# Patient Record
Sex: Male | Born: 2000 | Race: White | Hispanic: No | Marital: Single | Smoking: Former smoker
Health system: Southern US, Community
[De-identification: ages and names within clinical notes are randomized; demographics above are authoritative.]

## PROBLEM LIST (undated history)

## (undated) DIAGNOSIS — F909 Attention-deficit hyperactivity disorder, unspecified type: Secondary | ICD-10-CM

## (undated) HISTORY — PX: OTHER SURGICAL HISTORY: SHX169

---
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
## 2021-05-30 ENCOUNTER — Emergency Department
Admission: EM | Admit: 2021-05-30 | Discharge: 2021-05-30 | Disposition: A | Discharge status: Home / Self Care | Payer: Medicaid Other | Attending: Emergency Medicine | Admitting: Emergency Medicine

## 2021-05-30 ENCOUNTER — Other Ambulatory Visit: Payer: Self-pay

## 2021-05-30 DIAGNOSIS — Z87891 Personal history of nicotine dependence: Secondary | ICD-10-CM | POA: Diagnosis not present

## 2021-05-30 DIAGNOSIS — Z20822 Contact with and (suspected) exposure to covid-19: Secondary | ICD-10-CM | POA: Diagnosis not present

## 2021-05-30 DIAGNOSIS — B349 Viral infection, unspecified: Secondary | ICD-10-CM | POA: Diagnosis not present

## 2021-05-30 DIAGNOSIS — Z8616 Personal history of COVID-19: Secondary | ICD-10-CM | POA: Diagnosis not present

## 2021-05-30 DIAGNOSIS — R059 Cough, unspecified: Secondary | ICD-10-CM | POA: Diagnosis present

## 2021-05-30 LAB — RESP PANEL BY RT-PCR (FLU A&B, COVID) ARPGX2
Influenza A by PCR: NEGATIVE
Influenza B by PCR: NEGATIVE
SARS Coronavirus 2 by RT PCR: NEGATIVE

## 2021-05-30 MED ORDER — ONDANSETRON 4 MG PO TBDP
4.0000 mg | ORAL_TABLET | Freq: Once | ORAL | Status: AC
  Administered 2021-05-30: 11:00:00 4 mg via ORAL
  Filled 2021-05-30: qty 1

## 2021-05-30 MED ORDER — ONDANSETRON 4 MG PO TBDP: 4.0000 mg | ORAL_TABLET | Freq: Three times a day (TID) | ORAL | 0 refills | Status: AC | PRN

## 2021-05-30 NOTE — ED Provider Notes (Signed)
[facility] Provider Note    Event Date/Time   First MD Initiated Contact with Patient 05/30/21 1020     (approximate)   History   URI   HPI  Todd Hess is a 21 y.o. male   to the ED this morning with complaint of nausea and 1 episode of vomiting.  He has also had cough, chills, fever and body aches.  Patient has had COVID once.  He is not vaccinated for COVID.  He is unaware of any known COVID or influenza exposure.  Patient is a former smoker and he has a history of ADHD.      Physical Exam   Triage Vital Signs: ED Triage Vitals [05/30/21 1018]  Enc Vitals Group     BP (!) 125/91     Pulse Rate (!) 112     Resp 16     Temp 98.7 F (37.1 C)     Temp Source Oral     SpO2 95 %     Weight      Height      Head Circumference      Peak Flow      Pain Score      Pain Loc      Pain Edu?      Excl. in GC?     Most recent vital signs: Vitals:   05/30/21 1018  BP: (!) 125/91  Pulse: (!) 112  Resp: 16  Temp: 98.7 F (37.1 C)  SpO2: 95%     General: Awake, no distress.  Able to talk in complete sentences without any difficulty or shortness of breath. CV:  Good peripheral perfusion.  Heart regular rate and rhythm without murmur. Resp:  Normal effort.  Lungs are clear bilaterally. Abd:  No distention.      ED Results / Procedures / Treatments   Labs (all labs ordered are listed, but only abnormal results are displayed) Labs Reviewed  RESP PANEL BY RT-PCR (FLU A&B, COVID) [id number]      PROCEDURES:  Critical Care performed:   Procedures   MEDICATIONS ORDERED IN ED: Medications  ondansetron (ZOFRAN-ODT) disintegrating tablet 4 mg (4 mg Oral Given 05/30/21 1031)     IMPRESSION / MDM / ASSESSMENT AND PLAN / ED COURSE  I reviewed the triage vital signs and the nursing notes.   Differential diagnosis includes, but is not limited to, influenza, COVID, viral illness.  21 year old male presents to the ED with complaint of  flulike symptoms that began this morning.  Patient states that he felt fine last evening when he went to sleep.  He reports emesis x1 but no diarrhea.  Patient is unaware of any known exposure to COVID or influenza.  Patient is a also experienced some fever, chills, body aches and cough.  While in the emergency department patient was given Zofran and no further vomiting noted.  Respiratory nasal swab was reviewed by myself and negative for influenza and COVID.  Patient was made aware.  A prescription for Zofran was sent to the pharmacy for him to continue.  Increase fluids to stay hydrated.  He may also take Tylenol or ibuprofen as needed for body aches, headache or fever.  A note was given for him to remain out of work.        FINAL CLINICAL IMPRESSION(S) / ED DIAGNOSES   Final diagnoses:  Viral illness     Rx / DC Orders   ED Discharge Orders  Ordered    ondansetron (ZOFRAN-ODT) 4 MG disintegrating tablet  Every 8 hours PRN        05/30/21 1156             Note:  This document was prepared using Dragon voice recognition software and may include unintentional dictation errors.   Tommi Rumps, PA-C 05/30/21 1205    Jene Every, MD 05/30/21 1332

## 2021-05-30 NOTE — ED Triage Notes (Signed)
Pt c/o waking this morning with cough, fever, chills, body aches, nausea and 1 episode of vomiting today

## 2021-05-30 NOTE — Discharge Instructions (Addendum)
Follow-up with your primary care provider if any continued problems or urgent care.  Increase fluids to stay hydrated.  Zofran was sent to the pharmacy for nausea and vomiting.  Tylenol or ibuprofen as needed for fever, chills or body aches.
